# Patient Record
Sex: Female | Born: 1975 | Race: White | Hispanic: No | Marital: Married | Smoking: Never smoker
Health system: Southern US, Community
[De-identification: ages and names within clinical notes are randomized; demographics above are authoritative.]

---
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
## 1997-08-26 ENCOUNTER — Inpatient Hospital Stay (HOSPITAL_COMMUNITY): Admission: AD | Admit: 1997-08-26 | Discharge: 1997-08-28 | Payer: Self-pay | Admitting: Obstetrics and Gynecology

## 1997-10-09 ENCOUNTER — Other Ambulatory Visit: Admission: RE | Admit: 1997-10-09 | Discharge: 1997-10-09 | Payer: Self-pay | Admitting: Obstetrics and Gynecology

## 1998-10-25 ENCOUNTER — Other Ambulatory Visit: Admission: RE | Admit: 1998-10-25 | Discharge: 1998-10-25 | Payer: Self-pay | Admitting: Obstetrics and Gynecology

## 1999-10-21 ENCOUNTER — Inpatient Hospital Stay (HOSPITAL_COMMUNITY): Admission: AD | Admit: 1999-10-21 | Discharge: 1999-10-21 | Payer: Self-pay | Admitting: Obstetrics and Gynecology

## 1999-10-29 ENCOUNTER — Inpatient Hospital Stay (HOSPITAL_COMMUNITY): Admission: AD | Admit: 1999-10-29 | Discharge: 1999-10-31 | Payer: Self-pay | Admitting: Obstetrics and Gynecology

## 1999-12-11 ENCOUNTER — Other Ambulatory Visit: Admission: RE | Admit: 1999-12-11 | Discharge: 1999-12-11 | Payer: Self-pay | Admitting: Obstetrics and Gynecology

## 2001-04-08 ENCOUNTER — Other Ambulatory Visit: Admission: RE | Admit: 2001-04-08 | Discharge: 2001-04-08 | Payer: Self-pay | Admitting: Obstetrics and Gynecology

## 2002-06-17 ENCOUNTER — Other Ambulatory Visit: Admission: RE | Admit: 2002-06-17 | Discharge: 2002-06-17 | Payer: Self-pay | Admitting: Obstetrics and Gynecology

## 2003-05-19 HISTORY — PX: OTHER SURGICAL HISTORY: SHX169

## 2003-11-08 ENCOUNTER — Other Ambulatory Visit: Admission: RE | Admit: 2003-11-08 | Discharge: 2003-11-08 | Payer: Self-pay | Admitting: Obstetrics and Gynecology

## 2004-08-08 ENCOUNTER — Observation Stay (HOSPITAL_COMMUNITY): Admission: RE | Admit: 2004-08-08 | Discharge: 2004-08-09 | Payer: Self-pay | Admitting: Specialist

## 2005-04-02 ENCOUNTER — Other Ambulatory Visit: Admission: RE | Admit: 2005-04-02 | Discharge: 2005-04-02 | Payer: Self-pay | Admitting: Obstetrics and Gynecology

## 2005-08-08 ENCOUNTER — Emergency Department (HOSPITAL_COMMUNITY): Admission: EM | Admit: 2005-08-08 | Discharge: 2005-08-09 | Payer: Self-pay | Admitting: *Deleted

## 2006-03-31 IMAGING — CR DG SPINE 1V PORT
1 series · 1 of 1 positions shown · non-contrast
Comparison: none

CLINICAL DATA: HNP, left L5-S1.  Lumbar surgery.
 LUMBAR SPINE - PORTABLE 1 VIEW:
 Please note this film was submitted postoperatively for interpretation.  
 Single lateral intraoperative view of the lumbar spine demonstrates posterior metallic probe directed at the L5 vertebral body.

[view not recorded]
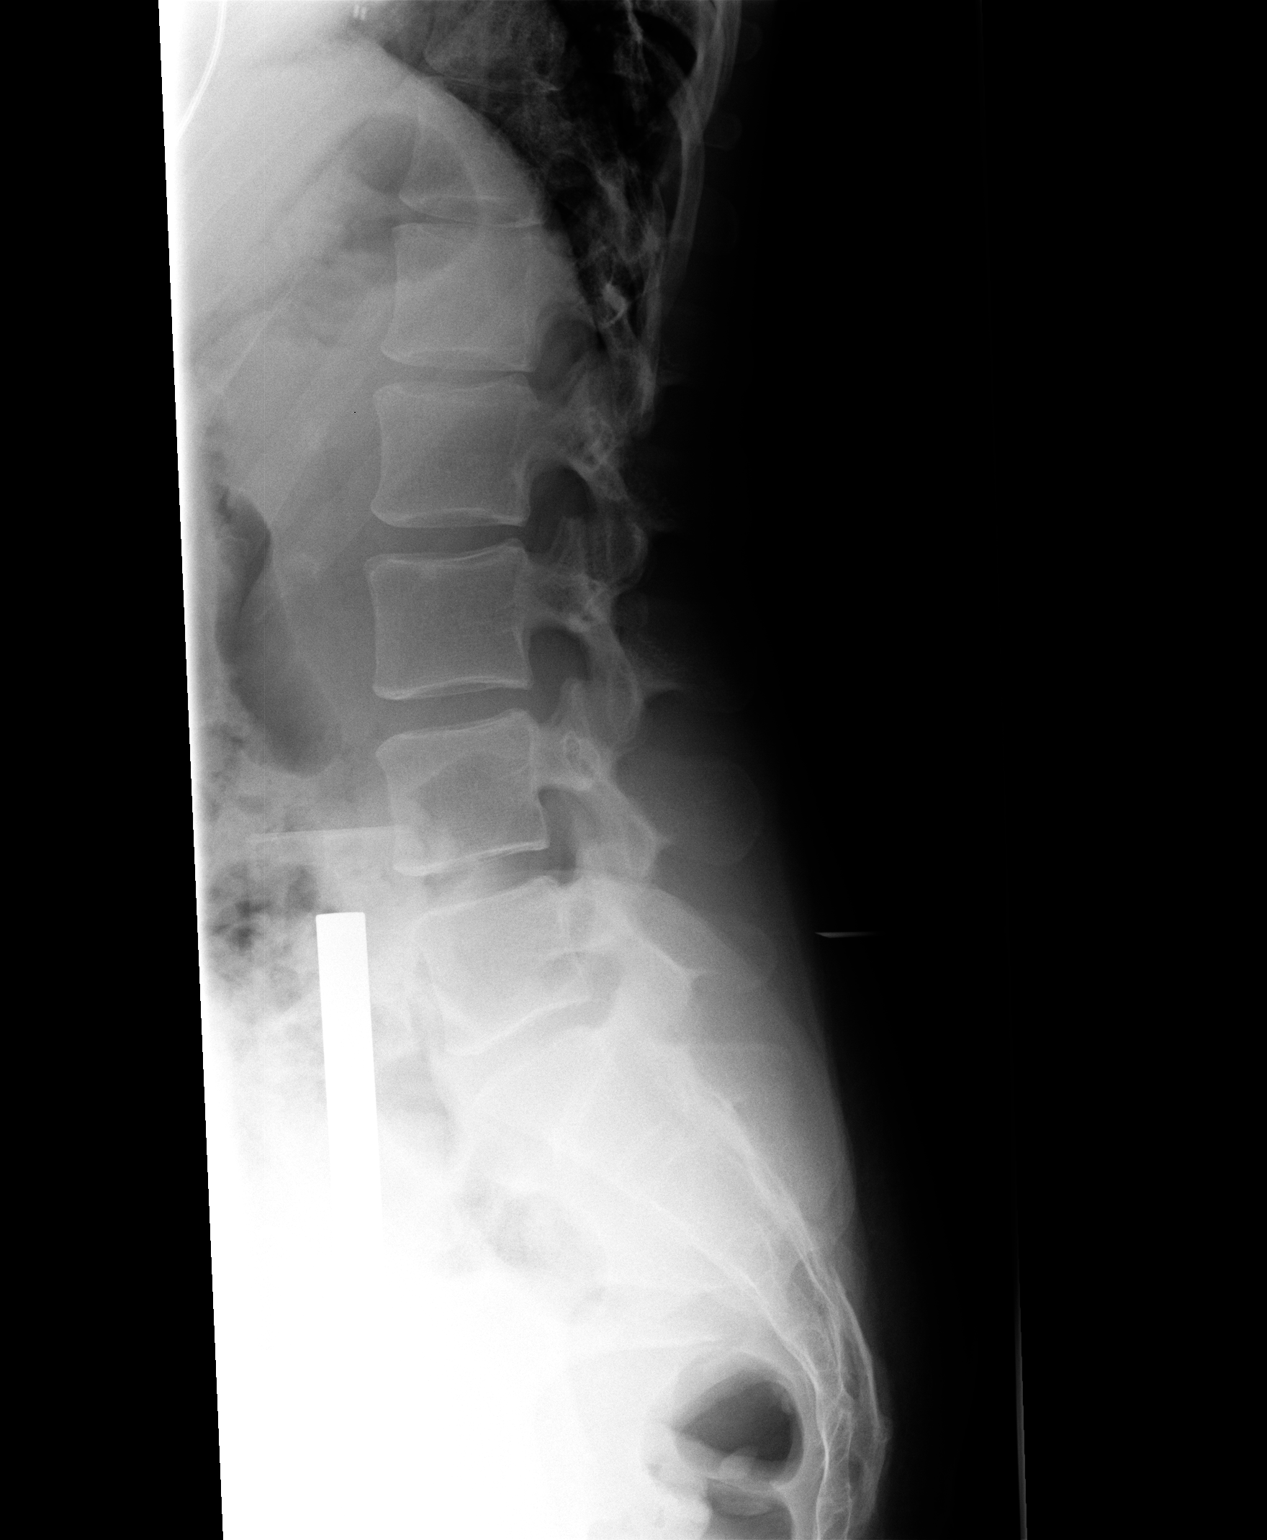

[1 of 1 positions shown; findings below may reference images not displayed]

IMPRESSION: Localizer.

## 2007-03-31 IMAGING — CR DG TIBIA/FIBULA 2V*R*
2 series · 2 of 2 positions shown · non-contrast
Comparison: none

CLINICAL DATA: Blow to the shin.  Pain.  
 RIGHT TIB/FIB ? 2 VIEW:

[view not recorded (1 of 2)]
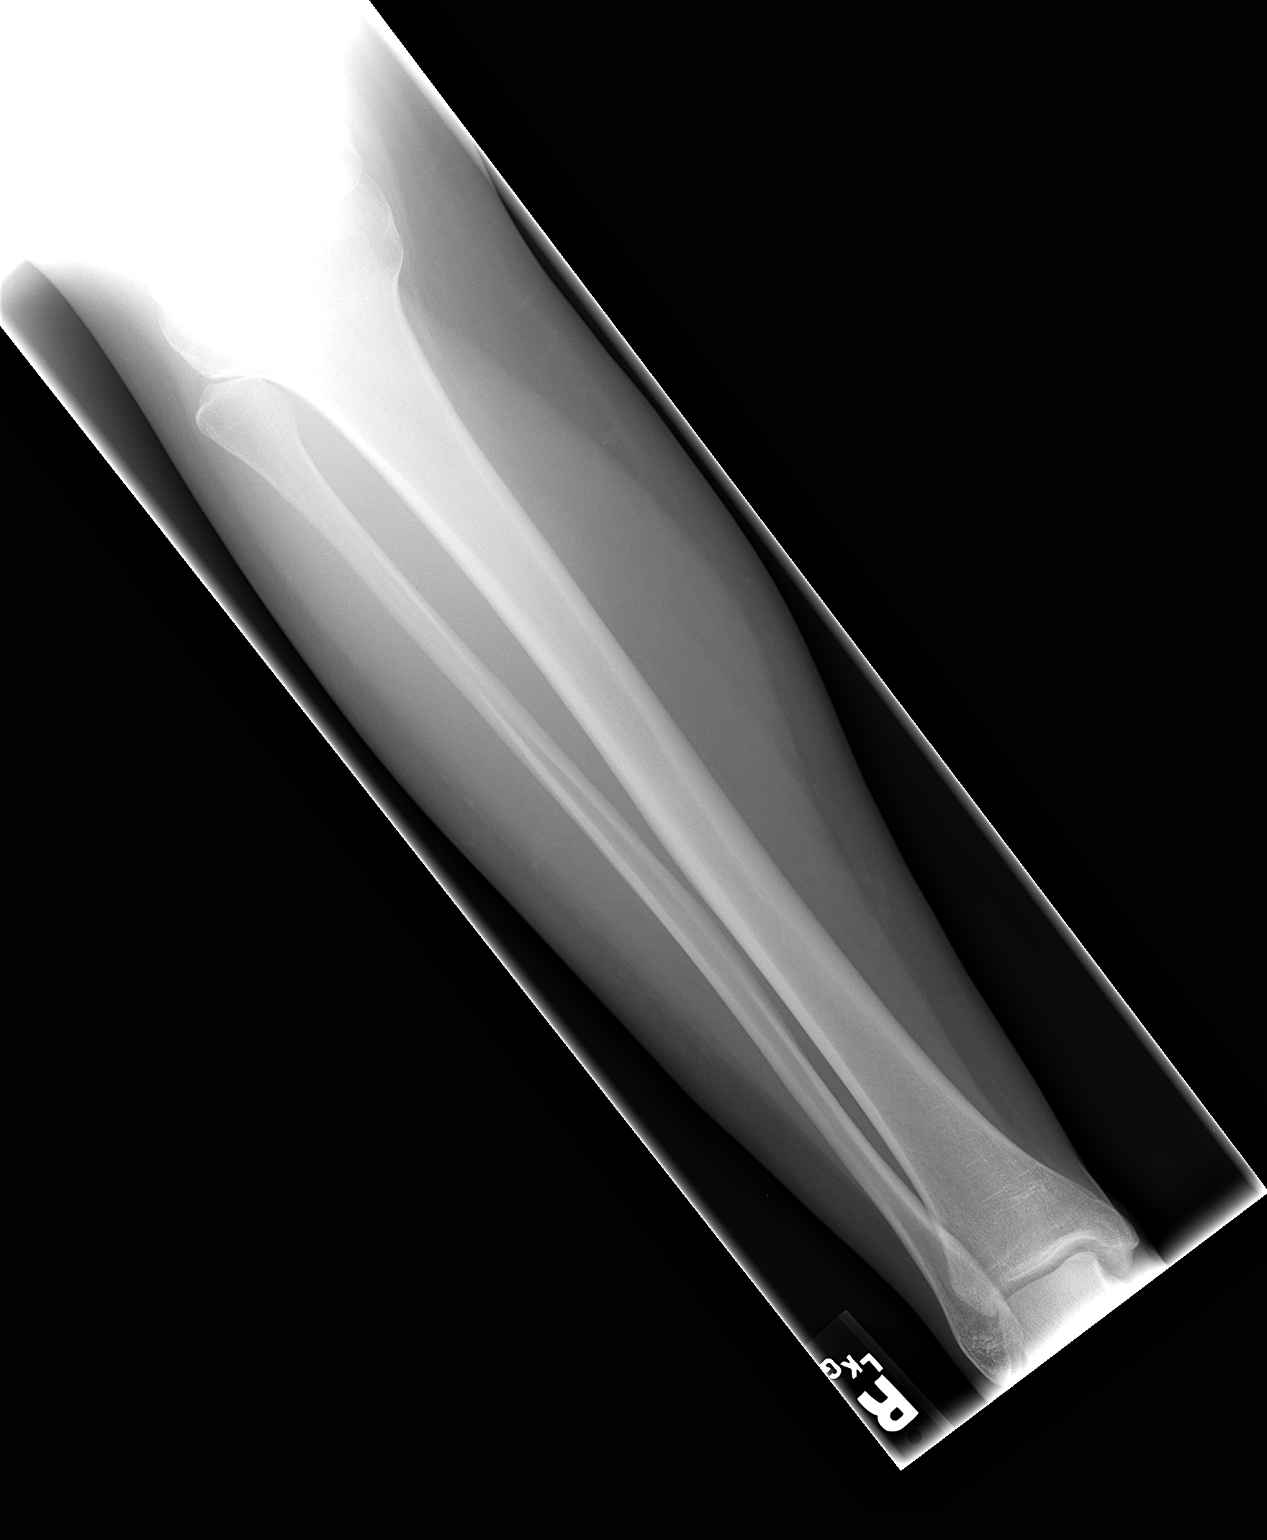

[view not recorded (2 of 2)]
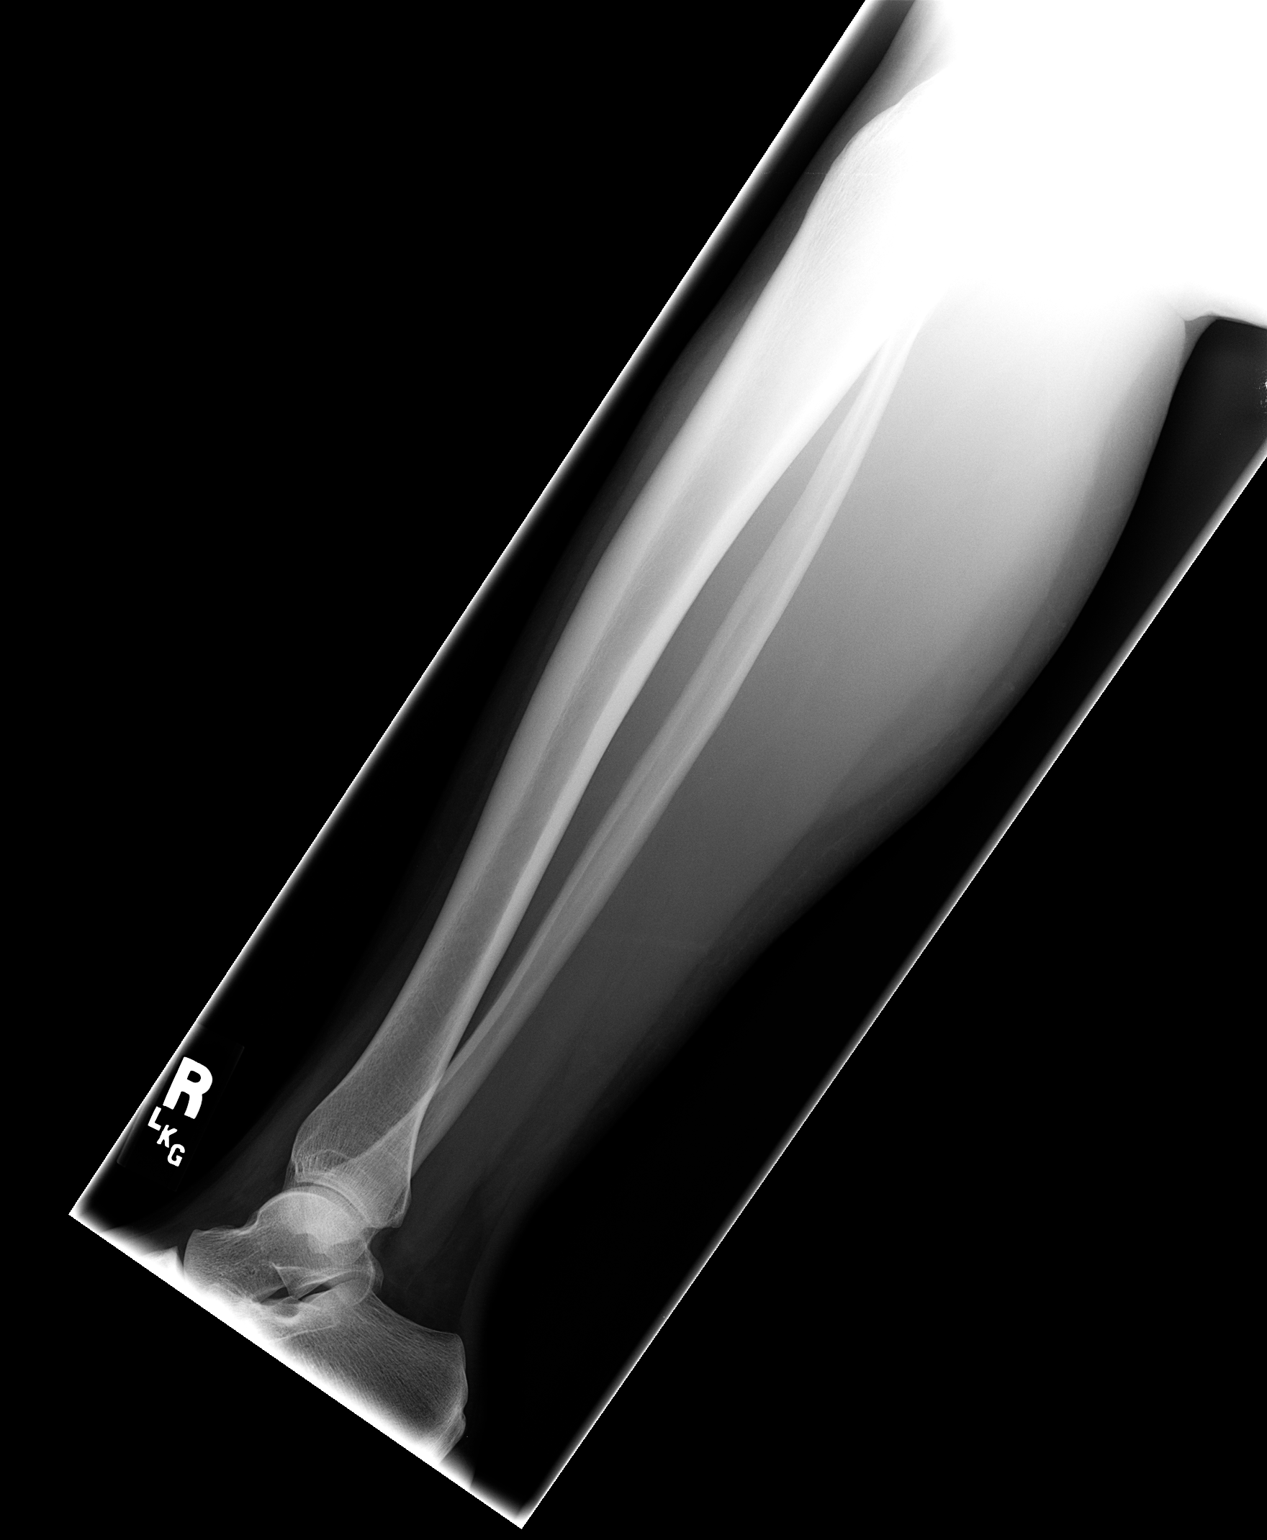

[2 of 2 positions shown; findings below may reference images not displayed]

FINDINGS: Imaged bones, joints and soft tissues appear normal.
IMPRESSION: Negative study.

## 2015-10-16 ENCOUNTER — Other Ambulatory Visit: Payer: Self-pay | Admitting: Obstetrics and Gynecology

## 2015-10-16 DIAGNOSIS — N63 Unspecified lump in unspecified breast: Secondary | ICD-10-CM

## 2015-10-19 ENCOUNTER — Other Ambulatory Visit: Payer: Self-pay

## 2015-11-30 ENCOUNTER — Ambulatory Visit
Admission: RE | Admit: 2015-11-30 | Discharge: 2015-11-30 | Disposition: A | Discharge status: Home / Self Care | Source: Ambulatory Visit | Attending: Obstetrics and Gynecology | Admitting: Obstetrics and Gynecology

## 2015-11-30 DIAGNOSIS — N63 Unspecified lump in unspecified breast: Secondary | ICD-10-CM

## 2019-07-12 ENCOUNTER — Ambulatory Visit (INDEPENDENT_AMBULATORY_CARE_PROVIDER_SITE_OTHER): Admitting: Physician Assistant

## 2019-07-12 ENCOUNTER — Encounter: Payer: Self-pay | Admitting: Physician Assistant

## 2019-07-12 ENCOUNTER — Other Ambulatory Visit: Payer: Self-pay

## 2019-07-12 VITALS — BP 122/82 | HR 94 | Temp 98.8°F | Ht 62.0 in | Wt 146.0 lb

## 2019-07-12 DIAGNOSIS — J060 Acute laryngopharyngitis: Secondary | ICD-10-CM | POA: Diagnosis not present

## 2019-07-12 MED ORDER — MONTELUKAST SODIUM 10 MG PO TABS: ORAL_TABLET | ORAL | 3 refills | Status: AC

## 2019-07-12 MED ORDER — CEFDINIR 300 MG PO CAPS: 300.0000 mg | ORAL_CAPSULE | Freq: Two times a day (BID) | ORAL | 0 refills | Status: DC

## 2019-07-12 MED ORDER — PREDNISONE 20 MG PO TABS: ORAL_TABLET | ORAL | 0 refills | Status: DC

## 2019-07-12 NOTE — Assessment & Plan Note (Signed)
rx for omnicef Use her decongestants as directed rx for prednisone rx for singulair

## 2019-07-12 NOTE — Progress Notes (Signed)
Acute Office Visit  Subjective:    Patient ID: Danielle Bautista, female    DOB: 12/15/1975, 44 y.o.   MRN: [id number]  Chief Complaint  Patient presents with  . Sinusitis    HPI Patient is in today for URI/allergies  Patient states for the past month she has had allergy symptoms with PND, drainage, runny nose and ears popping - she has tried several otc meds Now she has had a constant irritative cough that is only minimally productive Denies fever/malaise   History reviewed. No pertinent past medical history.  Past Surgical History:  Procedure Laterality Date  . MICRODISECTOMY  05/2003  . TONSILECTOMY/ADENOIDECTOMY WITH MYRINGOTOMY  1984    Family History  Problem Relation Age of Onset  . Osteoporosis Mother   . Mental illness Mother   . Hypertension Father   . Diabetes type II Father     Social History   Socioeconomic History  . Marital status: Married    Spouse name: Not on file  . Number of children: Not on file  . Years of education: Not on file  . Highest education level: Not on file  Occupational History  . Occupation: [name]  Tobacco Use  . Smoking status: Never Smoker  . Smokeless tobacco: Never Used  Substance and Sexual Activity  . Alcohol use: Not on file    Comment: Drinks approximately 1 time per week. When she drinks, the average quantity of alcohol is 3-4 drinks. She typically consumes beer, wine, and mixed drinks.   . Drug use: Never  . Sexual activity: Not on file  Other Topics Concern  . Not on file  Social History Narrative  . Not on file   Social Determinants of Health   Financial Resource Strain:   . Difficulty of Paying Living Expenses:   Food Insecurity:   . Worried About Charity fundraiser in the Last Year:   . Arboriculturist in the Last Year:   Transportation Needs:   . Film/video editor (Medical):   Marland Kitchen Lack of Transportation (Non-Medical):   Physical Activity:   . Days of Exercise per Week:   .  Minutes of Exercise per Session:   Stress:   . Feeling of Stress :   Social Connections:   . Frequency of Communication with Friends and Family:   . Frequency of Social Gatherings with Friends and Family:   . Attends Religious Services:   . Active Member of Clubs or Organizations:   . Attends Archivist Meetings:   Marland Kitchen Marital Status:   Intimate Partner Violence:   . Fear of Current or Ex-Partner:   . Emotionally Abused:   Marland Kitchen Physically Abused:   . Sexually Abused:      Current Outpatient Medications:  .  cefdinir (OMNICEF) 300 MG capsule, Take 1 capsule (300 mg total) by mouth 2 (two) times daily., Disp: 20 capsule, Rfl: 0 .  montelukast (SINGULAIR) 10 MG tablet, 1 po qhs for allergies/cough, Disp: 30 tablet, Rfl: 3 .  predniSONE (DELTASONE) 20 MG tablet, 1 po tid for 3 days 1 po bid for 3 days 1 po qd for 3 days, Disp: 18 tablet, Rfl: 0   No Known Allergies  CONSTITUTIONAL: Negative for chills, fatigue, fever, unintentional weight gain and unintentional weight loss.  E/N/T: see HPI CARDIOVASCULAR: Negative for chest pain, dizziness, palpitations and pedal edema.  RESPIRATORY:see HPI INTEGUMENTARY: Negative for rash.  NEUROLOGICAL: Negative for dizziness and headaches.  PSYCHIATRIC: Negative  for sleep disturbance and to question depression screen.  Negative for depression, negative for anhedonia.         Objective:    PHYSICAL EXAM:   VS: BP 122/82 (BP Location: Left Arm, Patient Position: Sitting)   Pulse 94   Temp 98.8 F (37.1 C) (Temporal)   Ht 5\' 2"  (1.575 m)   Wt 146 lb (66.2 kg)   SpO2 98%   BMI 26.70 kg/m   GEN: Well nourished, well developed, in no acute distress  HEENT: normal external ears and nose -- Lips, Teeth and Gums - normal  Oropharynx - mild erythema  Cardiac: RRR; no murmurs, rubs, or gallops,no edema -  Respiratory:  normal respiratory rate and pattern with no distress - normal breath sounds with no rales, rhonchi, wheezes or  rubs    Wt Readings from Last 3 Encounters:  07/12/19 146 lb (66.2 kg)    Health Maintenance Due  Topic Date Due  . HIV Screening  Never done  . PAP SMEAR-Modifier  Never done    There are no preventive care reminders to display for this patient.        Assessment & Plan:   Problem List Items Addressed This Visit      Respiratory   Acute laryngopharyngitis - Primary    rx for omnicef Use her decongestants as directed rx for prednisone rx for singulair          Meds ordered this encounter  Medications  . cefdinir (OMNICEF) 300 MG capsule    Sig: Take 1 capsule (300 mg total) by mouth 2 (two) times daily.    Dispense:  20 capsule    Refill:  0    Order Specific Question:   Supervising Provider    Answer05/27/21 Blane Ohara  . predniSONE (DELTASONE) 20 MG tablet    Sig: 1 po tid for 3 days 1 po bid for 3 days 1 po qd for 3 days    Dispense:  18 tablet    Refill:  0    Order Specific Question:   Supervising Provider    [id number] Blane Ohara  . montelukast (SINGULAIR) 10 MG tablet    Sig: 1 po qhs for allergies/cough    Dispense:  30 tablet    Refill:  3    Order Specific Question:   Supervising Provider    Answer:   [facility], [id number] Fritzi Mandes     SARA R Akilah Cureton, PA-C

## 2020-11-02 LAB — HM PAP SMEAR: HM Pap smear: NORMAL

## 2020-12-03 ENCOUNTER — Ambulatory Visit (INDEPENDENT_AMBULATORY_CARE_PROVIDER_SITE_OTHER): Admitting: Nurse Practitioner

## 2020-12-03 ENCOUNTER — Encounter: Payer: Self-pay | Admitting: Nurse Practitioner

## 2020-12-03 VITALS — BP 122/64 | HR 87 | Temp 97.3°F | Ht 62.0 in | Wt 158.0 lb

## 2020-12-03 DIAGNOSIS — J309 Allergic rhinitis, unspecified: Secondary | ICD-10-CM | POA: Diagnosis not present

## 2020-12-03 DIAGNOSIS — J018 Other acute sinusitis: Secondary | ICD-10-CM

## 2020-12-03 DIAGNOSIS — H6123 Impacted cerumen, bilateral: Secondary | ICD-10-CM | POA: Diagnosis not present

## 2020-12-03 DIAGNOSIS — H9201 Otalgia, right ear: Secondary | ICD-10-CM

## 2020-12-03 MED ORDER — AZITHROMYCIN 250 MG PO TABS: ORAL_TABLET | ORAL | 0 refills | Status: AC

## 2020-12-03 MED ORDER — FLUTICASONE PROPIONATE 50 MCG/ACT NA SUSP: 2.0000 | Freq: Every day | NASAL | 6 refills | Status: AC

## 2020-12-03 NOTE — Progress Notes (Signed)
Acute Office Visit  Subjective:    Patient ID: Danielle Bautista, female    DOB: 1975-05-08, 45 y.o.   MRN: [id number]  CC: Right ear pain  HPI: Danielle Bautista is a 45 year old Caucasian female that presents with right ear pain and sinus congestion. States onset was one week ago. Treatment has included Tylenol Sinus and Cold, Sudafed, and Ibuprofen OTC. She has also used a Netty pot. States she has a past history of chronic allergic rhinitis and ear infections.      Past Surgical History:  Procedure Laterality Date   MICRODISECTOMY  05/2003   TONSILECTOMY/ADENOIDECTOMY WITH MYRINGOTOMY  1984    Family History  Problem Relation Age of Onset   Osteoporosis Mother    Mental illness Mother    Hypertension Father    Diabetes type II Father     Social History   Socioeconomic History   Marital status: Married    Spouse name: Not on file   Number of children: Not on file   Years of education: Not on file   Highest education level: Not on file  Occupational History   Occupation: [employer]  Tobacco Use   Smoking status: Never   Smokeless tobacco: Never  Vaping Use   Vaping Use: Never used  Substance and Sexual Activity   Alcohol use: Not on file    Comment: Drinks approximately 1 time per week. When she drinks, the average quantity of alcohol is 3-4 drinks. She typically consumes beer, wine, and mixed drinks.    Drug use: Never   Sexual activity: Not on file  Other Topics Concern   Not on file  Social History Narrative   Not on file   Social Determinants of Health   Financial Resource Strain: Not on file  Food Insecurity: Not on file  Transportation Needs: Not on file  Physical Activity: Not on file  Stress: Not on file  Social Connections: Not on file  Intimate Partner Violence: Not on file    Outpatient Medications Prior to Visit  Medication Sig Dispense Refill   cefdinir (OMNICEF) 300 MG capsule Take 1 capsule (300 mg total) by mouth 2 (two) times  daily. 20 capsule 0   montelukast (SINGULAIR) 10 MG tablet 1 po qhs for allergies/cough 30 tablet 3   predniSONE (DELTASONE) 20 MG tablet 1 po tid for 3 days 1 po bid for 3 days 1 po qd for 3 days 18 tablet 0   No facility-administered medications prior to visit.    No Known Allergies  Review of Systems  Constitutional:  Negative for chills, fatigue and fever.  HENT:  Positive for congestion, ear pain (right), postnasal drip, rhinorrhea, sinus pressure, sinus pain and sore throat.   Eyes: Negative.   Respiratory:  Negative for cough and shortness of breath.   Cardiovascular:  Negative for chest pain.  Gastrointestinal:  Positive for nausea (In the a.m.). Negative for diarrhea.  Endocrine: Negative.   Genitourinary: Negative.   Allergic/Immunologic: Positive for environmental allergies.  Neurological:  Positive for headaches. Negative for dizziness.      Objective:    Physical Exam Vitals reviewed.  Constitutional:      Appearance: Normal appearance.  HENT:     Head: Normocephalic.     Right Ear: There is impacted cerumen.     Left Ear: There is impacted cerumen.     Nose: Congestion and rhinorrhea present.     Mouth/Throat:     Pharynx: Posterior oropharyngeal erythema present.  Cardiovascular:     Rate and Rhythm: Normal rate and regular rhythm.  Pulmonary:     Effort: Pulmonary effort is normal.     Breath sounds: Normal breath sounds.  Skin:    General: Skin is warm and dry.     Capillary Refill: Capillary refill takes less than 2 seconds.  Neurological:     General: No focal deficit present.     Mental Status: She is alert.    BP 122/64   Pulse 87   Temp (!) 97.3 F (36.3 C)   Ht 5\' 2"  (1.575 m)   Wt 158 lb (71.7 kg)   LMP 11/12/2020 (Approximate)   SpO2 100%   BMI 28.90 kg/m   Wt Readings from Last 3 Encounters:  07/12/19 146 lb (66.2 kg)    Health Maintenance Due  Topic Date Due   COVID-19 Vaccine (1) Never done   HIV Screening  Never done    Hepatitis C Screening  Never done   PAP SMEAR-Modifier  Never done   INFLUENZA VACCINE  Never done           Assessment & Plan:   1. Acute non-recurrent sinusitis of other sinus - fluticasone (FLONASE) 50 MCG/ACT nasal spray; Place 2 sprays into both nostrils daily.  Dispense: 16 g; Refill: 6 - azithromycin (ZITHROMAX) 250 MG tablet; Take 2 tablets on day 1, then 1 tablet daily on days 2 through 5  Dispense: 6 tablet; Refill: 0  2. Chronic allergic rhinitis - fluticasone (FLONASE) 50 MCG/ACT nasal spray; Place 2 sprays into both nostrils daily.  Dispense: 16 g; Refill: 6  3. Impacted cerumen of both ears - Ear wax removal -Debrox as needed for ear wax  4. Otalgia of right ear - Ear wax removal   -continue Ibuprofen as needed for ear pain   Take Z-pack as directed Debrox over-the-counter ear wax drops for softening future ear wax Use Flonase nasal spray daily Follow-up as needed   Follow-up: PRN, if symptoms fail to improve or worsen  An After Visit Summary was printed and given to the patient.  07/14/19, NP [facility] [phone]

## 2020-12-03 NOTE — Patient Instructions (Addendum)
Take Z-pack as directed Debrox over-the-counter ear wax drops for softening future ear wax Use Flonase nasal spray daily Follow-up as needed   Sinusitis, Adult Sinusitis is soreness and swelling (inflammation) of your sinuses. Sinuses are hollow spaces in the bones around your face. They are located: Around your eyes. In the middle of your forehead. Behind your nose. In your cheekbones. Your sinuses and nasal passages are lined with a fluid called mucus. Mucus drains out of your sinuses. Swelling can trap mucus in your sinuses. This lets germs (bacteria, virus, or fungus) grow, which leads to infection. Most of the time, this condition is caused by a virus. What are the causes? This condition is caused by: Allergies. Asthma. Germs. Things that block your nose or sinuses. Growths in the nose (nasal polyps). Chemicals or irritants in the air. Fungus (rare). What increases the risk? You are more likely to develop this condition if: You have a weak body defense system (immune system). You do a lot of swimming or diving. You use nasal sprays too much. You smoke. What are the signs or symptoms? The main symptoms of this condition are pain and a feeling of pressure around the sinuses. Other symptoms include: Stuffy nose (congestion). Runny nose (drainage). Swelling and warmth in the sinuses. Headache. Toothache. A cough that may get worse at night. Mucus that collects in the throat or the back of the nose (postnasal drip). Being unable to smell and taste. Being very tired (fatigue). A fever. Sore throat. Bad breath. How is this diagnosed? This condition is diagnosed based on: Your symptoms. Your medical history. A physical exam. Tests to find out if your condition is short-term (acute) or long-term (chronic). Your doctor may: Check your nose for growths (polyps). Check your sinuses using a tool that has a light (endoscope). Check for allergies or germs. Do imaging tests,  such as an MRI or CT scan. How is this treated? Treatment for this condition depends on the cause and whether it is short-term or long-term. If caused by a virus, your symptoms should go away on their own within 10 days. You may be given medicines to relieve symptoms. They include: Medicines that shrink swollen tissue in the nose. Medicines that treat allergies (antihistamines). A spray that treats swelling of the nostrils.  Rinses that help get rid of thick mucus in your nose (nasal saline washes). If caused by bacteria, your doctor may wait to see if you will get better without treatment. You may be given antibiotic medicine if you have: A very bad infection. A weak body defense system. If caused by growths in the nose, you may need to have surgery. Follow these instructions at home: Medicines Take, use, or apply over-the-counter and prescription medicines only as told by your doctor. These may include nasal sprays. If you were prescribed an antibiotic medicine, take it as told by your doctor. Do not stop taking the antibiotic even if you start to feel better. Hydrate and humidify  Drink enough water to keep your pee (urine) pale yellow. Use a cool mist humidifier to keep the humidity level in your home above 50%. Breathe in steam for 10-15 minutes, 3-4 times a day, or as told by your doctor. You can do this in the bathroom while a hot shower is running. Try not to spend time in cool or dry air. Rest Rest as much as you can. Sleep with your head raised (elevated). Make sure you get enough sleep each night. General instructions  Put  a warm, moist washcloth on your face 3-4 times a day, or as often as told by your doctor. This will help with discomfort. Wash your hands often with soap and water. If there is no soap and water, use hand sanitizer. Do not smoke. Avoid being around people who are smoking (secondhand smoke). Keep all follow-up visits as told by your doctor. This is  important. Contact a doctor if: You have a fever. Your symptoms get worse. Your symptoms do not get better within 10 days. Get help right away if: You have a very bad headache. You cannot stop throwing up (vomiting). You have very bad pain or swelling around your face or eyes. You have trouble seeing. You feel confused. Your neck is stiff. You have trouble breathing. Summary Sinusitis is swelling of your sinuses. Sinuses are hollow spaces in the bones around your face. This condition is caused by tissues in your nose that become inflamed or swollen. This traps germs. These can lead to infection. If you were prescribed an antibiotic medicine, take it as told by your doctor. Do not stop taking it even if you start to feel better. Keep all follow-up visits as told by your doctor. This is important. This information is not intended to replace advice given to you by your health care provider. Make sure you discuss any questions you have with your health care provider. Document Revised: 07/06/2017 Document Reviewed: 07/06/2017 Elsevier Patient Education  2022 [employer] Earache, Adult An earache, or ear pain, can be caused by many things, including: An infection. Ear wax buildup. Ear pressure. Something in the ear that should not be there (foreign body). A sore throat. Tooth problems. Jaw problems. Treatment of the earache will depend on the cause. If the cause is not clear or cannot be determined, you may need to watch your symptoms until your earache goes away or until a cause is found. Follow these instructions at home: Medicines Take or apply over-the-counter and prescription medicines only as told by your health care provider. If you were prescribed an antibiotic medicine, use it as told by your health care provider. Do not stop using the antibiotic even if you start to feel better. Do not put anything in your ear other than medicine that is prescribed by your health care  provider. Managing pain If directed, apply heat to the affected area as often as told by your health care provider. Use the heat source that your health care provider recommends, such as a moist heat pack or a heating pad. Place a towel between your skin and the heat source. Leave the heat on for 20-30 minutes. Remove the heat if your skin turns bright red. This is especially important if you are unable to feel pain, heat, or cold. You may have a greater risk of getting burned. If directed, put ice on the affected area as often as told by your health care provider. To do this:   Put ice in a plastic bag. Place a towel between your skin and the bag. Leave the ice on for 20 minutes, 2-3 times a day. General instructions Pay attention to any changes in your symptoms. Try resting in an upright position instead of lying down. This may help to reduce pressure in your ear and relieve pain. Chew gum if it helps to relieve your ear pain. Treat any allergies as told by your health care provider. Drink enough fluid to keep your urine pale yellow. It is up to you to get  the results of any tests that were done. Ask your health care provider, or the department that is doing the tests, when your results will be ready. Keep all follow-up visits as told by your health care provider. This is important. Contact a health care provider if: Your pain does not improve within 2 days. Your earache gets worse. You have new symptoms. You have a fever. Get help right away if you: Have a severe headache. Have a stiff neck. Have trouble swallowing. Have redness or swelling behind your ear. Have fluid or blood coming from your ear. Have hearing loss. Feel dizzy. Summary An earache, or ear pain, can be caused by many things. Treatment of the earache will depend on the cause. Follow recommendations from your health care provider to treat your ear pain. If the cause is not clear or cannot be determined, you may  need to watch your symptoms until your earache goes away or until a cause is found. Keep all follow-up visits as told by your health care provider. This is important. This information is not intended to replace advice given to you by your health care provider. Make sure you discuss any questions you have with your health care provider. Document Revised: 09/11/2018 Document Reviewed: 09/11/2018 Elsevier Patient Education  2022 [employer]. Earwax Buildup, Adult The ears produce a substance called earwax that helps keep bacteria out of the ear and protects the skin in the ear canal. Occasionally, earwax can build up in the ear and cause discomfort or hearing loss. What are the causes? This condition is caused by a buildup of earwax. Ear canals are self-cleaning. Ear wax is made in the outer part of the ear canal and generally falls out in small amounts over time. When the self-cleaning mechanism is not working, earwax builds up and can cause decreased hearing and discomfort. Attempting to clean ears with cotton swabs can push the earwax deep into the ear canal and cause decreased hearing and pain. What increases the risk? This condition is more likely to develop in people who: Clean their ears often with cotton swabs. Pick at their ears. Use earplugs or in-ear headphones often, or wear hearing aids. The following factors may also make you more likely to develop this condition: Being female. Being of older age. Naturally producing more earwax. Having narrow ear canals. Having earwax that is overly thick or sticky. Having excess hair in the ear canal. Having eczema. Being dehydrated. What are the signs or symptoms? Symptoms of this condition include: Reduced or muffled hearing. A feeling of fullness in the ear or feeling that the ear is plugged. Fluid coming from the ear. Ear pain or an itchy ear. Ringing in the ear. Coughing. Balance problems. An obvious piece of earwax that can be seen  inside the ear canal. How is this diagnosed? This condition may be diagnosed based on: Your symptoms. Your medical history. An ear exam. During the exam, your health care provider will look into your ear with an instrument called an otoscope. You may have tests, including a hearing test. How is this treated? This condition may be treated by: Using ear drops to soften the earwax. Having the earwax removed by a health care provider. The health care provider may: Flush the ear with water. Use an instrument that has a loop on the end (curette). Use a suction device. Having surgery to remove the wax buildup. This may be done in severe cases. Follow these instructions at home:  Take over-the-counter and  prescription medicines only as told by your health care provider. Do not put any objects, including cotton swabs, into your ear. You can clean the opening of your ear canal with a washcloth or facial tissue. Follow instructions from your health care provider about cleaning your ears. Do not overclean your ears. Drink enough fluid to keep your urine pale yellow. This will help to thin the earwax. Keep all follow-up visits as told. If earwax builds up in your ears often or if you use hearing aids, consider seeing your health care provider for routine, preventive ear cleanings. Ask your health care provider how often you should schedule your cleanings. If you have hearing aids, clean them according to instructions from the manufacturer and your health care provider. Contact a health care provider if: You have ear pain. You develop a fever. You have pus or other fluid coming from your ear. You have hearing loss. You have ringing in your ears that does not go away. You feel like the room is spinning (vertigo). Your symptoms do not improve with treatment. Get help right away if: You have bleeding from the affected ear. You have severe ear pain. Summary Earwax can build up in the ear and cause  discomfort or hearing loss. The most common symptoms of this condition include reduced or muffled hearing, a feeling of fullness in the ear, or feeling that the ear is plugged. This condition may be diagnosed based on your symptoms, your medical history, and an ear exam. This condition may be treated by using ear drops to soften the earwax or by having the earwax removed by a health care provider. Do not put any objects, including cotton swabs, into your ear. You can clean the opening of your ear canal with a washcloth or facial tissue. This information is not intended to replace advice given to you by your health care provider. Make sure you discuss any questions you have with your health care provider. Document Revised: 05/24/2019 Document Reviewed: 05/24/2019 Elsevier Patient Education  2022 [employer].

## 2022-01-22 ENCOUNTER — Ambulatory Visit (INDEPENDENT_AMBULATORY_CARE_PROVIDER_SITE_OTHER): Admitting: Physician Assistant

## 2022-01-22 ENCOUNTER — Encounter: Payer: Self-pay | Admitting: Physician Assistant

## 2022-01-22 VITALS — BP 116/80 | HR 95 | Temp 97.4°F | Ht 63.0 in | Wt 151.8 lb

## 2022-01-22 DIAGNOSIS — J069 Acute upper respiratory infection, unspecified: Secondary | ICD-10-CM | POA: Diagnosis not present

## 2022-01-22 DIAGNOSIS — H6123 Impacted cerumen, bilateral: Secondary | ICD-10-CM | POA: Diagnosis not present

## 2022-01-22 LAB — POC COVID19 BINAXNOW: SARS Coronavirus 2 Ag: NEGATIVE

## 2022-01-22 MED ORDER — AZITHROMYCIN 250 MG PO TABS: ORAL_TABLET | ORAL | 0 refills | Status: AC

## 2022-01-22 NOTE — Progress Notes (Signed)
Acute Office Visit  Subjective:    Patient ID: Danielle Bautista, female    DOB: May 07, 1975, 46 y.o.   MRN: [id number]  Chief Complaint  Patient presents with   Cough   Ear Fullness    HPI: Patient is in today for complaints of cough, congestion and ear fullness for the past week - has been taking otc meds but still with productive cough Ears stopped up as well  History reviewed. No pertinent past medical history.  Past Surgical History:  Procedure Laterality Date   MICRODISECTOMY  05/2003   TONSILECTOMY/ADENOIDECTOMY WITH MYRINGOTOMY  1984    Family History  Problem Relation Age of Onset   Osteoporosis Mother    Mental illness Mother    Hypertension Father    Diabetes type II Father     Social History   Socioeconomic History   Marital status: Married    Spouse name: Not on file   Number of children: Not on file   Years of education: Not on file   Highest education level: Not on file  Occupational History   Occupation: Danielle Bautista  Tobacco Use   Smoking status: Never   Smokeless tobacco: Never  Vaping Use   Vaping Use: Never used  Substance and Sexual Activity   Alcohol use: Not on file    Comment: Drinks approximately 1 time per week. When she drinks, the average quantity of alcohol is 3-4 drinks. She typically consumes beer, wine, and mixed drinks.    Drug use: Never   Sexual activity: Not on file  Other Topics Concern   Not on file  Social History Narrative   Not on file   Social Determinants of Health   Financial Resource Strain: Not on file  Food Insecurity: Not on file  Transportation Needs: Not on file  Physical Activity: Not on file  Stress: Not on file  Social Connections: Not on file  Intimate Partner Violence: Not on file    Outpatient Medications Prior to Visit  Medication Sig Dispense Refill   fluticasone (FLONASE) 50 MCG/ACT nasal spray Place 2 sprays into both nostrils daily. 16 g 6   montelukast (SINGULAIR) 10 MG tablet  1 po qhs for allergies/cough 30 tablet 3   No facility-administered medications prior to visit.    No Known Allergies  Review of Systems CONSTITUTIONAL: Negative for chills, fatigue, fever,  E/N/T: see HPI CARDIOVASCULAR: Negative for chest pain, dizziness,  RESPIRATORY: see HPI GASTROINTESTINAL: Negative for abdominal pain, acid reflux symptoms, constipation, diarrhea, nausea and vomiting.          Objective:    PHYSICAL EXAM:   VS: BP 116/80 (BP Location: Left Arm, Patient Position: Sitting, Cuff Size: Normal)   Pulse 95   Temp (!) 97.4 F (36.3 C) (Temporal)   Ht _0  (1.6 m)   Wt 151 lb 12.8 oz (68.9 kg)   SpO2 100%   BMI 26.89 kg/m   GEN: Well nourished, well developed, in no acute distress  HEENT: normal external ears and nose - both canals obscured by cerumen- irrigation done - hearing grossly normal - Lips, Teeth and Gums - normal  Oropharynx - normal mucosa, palate, and posterior pharynx Cardiac: RRR; no murmurs,  Respiratory:  scattered rhonchi noted Skin: warm and dry, no rash   Office Visit on 01/22/2022  Component Date Value Ref Range Status   SARS Coronavirus 2 Ag 01/22/2022 Negative  Negative Final    Health Maintenance Due  Topic Date Due  HIV Screening  Never done   Hepatitis C Screening  Never done   COLONOSCOPY (Pts 45-67yr Insurance coverage will need to be confirmed)  Never done    There are no preventive care reminders to display for this patient.   No results found for: "TSH" No results found for: "WBC", "HGB", "HCT", "MCV", "PLT" No results found for: "NA", "K", "CHLORIDE", "CO2", "GLUCOSE", "BUN", "CREATININE", "BILITOT", "ALKPHOS", "AST", "ALT", "PROT", "ALBUMIN", "CALCIUM", "ANIONGAP", "EGFR", "GFR" No results found for: "CHOL" No results found for: "HDL" No results found for: "LDLCALC" No results found for: "TRIG" No results found for: "CHOLHDL" No results found for: "HGBA1C"     Assessment & Plan:   Problem List Items  Addressed This Visit       Respiratory   Acute upper respiratory infection - Primary   Relevant Medications   azithromycin (ZITHROMAX) 250 MG tablet Otc decongestants     Nervous and Auditory   Impacted cerumen of both ears Irrigation improved - recommend use Murine ear wax removal   Meds ordered this encounter  Medications   azithromycin (ZITHROMAX) 250 MG tablet    Sig: Take 2 tablets on day 1, then 1 tablet daily on days 2 through 5    Dispense:  6 tablet    Refill:  0    Order Specific Question:   Supervising Provider    Answer:Danielle Bautista[[id number]   Orders Placed This Encounter  Procedures   POC COVID-19 BinaxNow     Follow-up: Return if symptoms worsen or fail to improve.  An After Visit Summary was printed and given to the patient.  [facility] ([phone]

## 2022-09-03 ENCOUNTER — Encounter: Payer: Self-pay | Admitting: Physician Assistant

## 2022-09-03 ENCOUNTER — Ambulatory Visit (INDEPENDENT_AMBULATORY_CARE_PROVIDER_SITE_OTHER): Admitting: Physician Assistant

## 2022-09-03 VITALS — BP 112/80 | HR 87 | Temp 97.7°F | Ht 63.0 in | Wt 145.0 lb

## 2022-09-03 DIAGNOSIS — R3129 Other microscopic hematuria: Secondary | ICD-10-CM

## 2022-09-03 DIAGNOSIS — R1084 Generalized abdominal pain: Secondary | ICD-10-CM | POA: Diagnosis not present

## 2022-09-03 DIAGNOSIS — L659 Nonscarring hair loss, unspecified: Secondary | ICD-10-CM | POA: Diagnosis not present

## 2022-09-03 DIAGNOSIS — R5383 Other fatigue: Secondary | ICD-10-CM

## 2022-09-03 DIAGNOSIS — R1032 Left lower quadrant pain: Secondary | ICD-10-CM | POA: Insufficient documentation

## 2022-09-03 DIAGNOSIS — R634 Abnormal weight loss: Secondary | ICD-10-CM

## 2022-09-03 LAB — POCT URINALYSIS DIP (CLINITEK)
Bilirubin, UA: NEGATIVE
Glucose, UA: NEGATIVE mg/dL
Ketones, POC UA: NEGATIVE mg/dL
Leukocytes, UA: NEGATIVE
Nitrite, UA: NEGATIVE
POC PROTEIN,UA: NEGATIVE
Spec Grav, UA: 1.015 (ref 1.010–1.025)
Urobilinogen, UA: 0.2 E.U./dL
pH, UA: 7 (ref 5.0–8.0)

## 2022-09-03 NOTE — Progress Notes (Signed)
Acute Office Visit  Subjective:    Patient ID: Danielle Bautista, female    DOB: 1976-02-06, 47 y.o.   MRN: [id number]  Chief Complaint  Patient presents with   fatigue    HPI: Patient is in today for complaints of fatigue for the past several months.  She states her energy level has decreased tremendously despite getting regular sleep  She has noted also other symptoms including recurrent nausea, change in bowels, has had episode of blood in bowel movement few weeks ago.  Has had intermittent lower abdominal pain and has noted some mild weight loss without trying to lose weight. Pt does see GI and has history of diverticulitis in past --- she had colonoscopy and endoscopy done in 12/2018 She has an appt already set up with Dr Loman Chroman next month Pt has not had a chronic visit or labwork with a provider in several years   Current Outpatient Medications:    fluticasone (FLONASE) 50 MCG/ACT nasal spray, Place 2 sprays into both nostrils daily., Disp: 16 g, Rfl: 6   montelukast (SINGULAIR) 10 MG tablet, 1 po qhs for allergies/cough, Disp: 30 tablet, Rfl: 3   Multiple Vitamin (DAILY VITAMIN PO), , Disp: , Rfl:   No Known Allergies  ROS CONSTITUTIONAL:see HPI E/N/T: Negative for ear pain, nasal congestion and sore throat.  CARDIOVASCULAR: Negative for chest pain, dizziness, palpitations and pedal edema.  RESPIRATORY: Negative for recent cough and dyspnea.  GASTROINTESTINAL: see HPI MSK: Negative for arthralgias and myalgias.  INTEGUMENTARY: Negative for rash.  NEUROLOGICAL: Negative for dizziness and headaches.       Objective:    PHYSICAL EXAM:   BP 112/80 (BP Location: Left Arm, Patient Position: Sitting, Cuff Size: Normal)   Pulse 87   Temp 97.7 F (36.5 C) (Temporal)   Ht 5\' 3"  (1.6 m)   Wt 145 lb (65.8 kg)   SpO2 98%   BMI 25.69 kg/m    GEN: Well nourished, well developed, in no acute distress  Cardiac: RRR; no murmurs, rubs, or gallops,no edema -  Respiratory:   normal respiratory rate and pattern with no distress - normal breath sounds with no rales, rhonchi, wheezes or rubs GI: normal bowel sounds, no masses or tenderness MS: no deformity or atrophy  Skin: warm and dry, no rash  Psych: euthymic mood, appropriate affect and demeanor Office Visit on 09/03/2022  Component Date Value Ref Range Status   Color, UA 09/03/2022 yellow  yellow Final   Clarity, UA 09/03/2022 cloudy (A)  clear Final   Glucose, UA 09/03/2022 negative  negative mg/dL Final   Bilirubin, UA 40/98/1191 negative  negative Final   Ketones, POC UA 09/03/2022 negative  negative mg/dL Final   Spec Grav, UA 47/82/9562 1.015  1.010 - 1.025 Final   Blood, UA 09/03/2022 trace-intact (A)  negative Final   pH, UA 09/03/2022 7.0  5.0 - 8.0 Final   POC PROTEIN,UA 09/03/2022 negative  negative, trace Final   Urobilinogen, UA 09/03/2022 0.2  0.2 or 1.0 E.U./dL Final   Nitrite, UA 13/09/6576 Negative  Negative Final   Leukocytes, UA 09/03/2022 Negative  Negative Final       Assessment & Plan:    Other fatigue -     POCT URINALYSIS DIP (CLINITEK) -     CBC with Differential/Platelet -     Comprehensive metabolic panel -     TSH -     VITAMIN D 25 Hydroxy (Vit-D Deficiency, Fractures) -     Iron,  TIBC and Ferritin Panel -     B12 and Folate Panel  Other microscopic hematuria -     POCT URINALYSIS DIP (CLINITEK) - will recheck at follow up visit  Hair loss -     CBC with Differential/Platelet -     Comprehensive metabolic panel -     TSH -     VITAMIN D 25 Hydroxy (Vit-D Deficiency, Fractures) -     Iron, TIBC and Ferritin Panel -     B12 and Folate Panel  Generalized abdominal pain -     CT ABDOMEN PELVIS W CONTRAST; Future  Weight loss -     CBC with Differential/Platelet -     Comprehensive metabolic panel -     TSH -     CT ABDOMEN PELVIS W CONTRAST; Future Follow up with GI as scheduled Left lower quadrant abdominal pain -     CT ABDOMEN PELVIS W CONTRAST;  Future     Follow-up: Return in about 4 weeks (around 10/01/2022) for follow-up.  An After Visit Summary was printed and given to the patient.  Jettie Pagan [facility] [phone]

## 2022-09-04 ENCOUNTER — Other Ambulatory Visit: Payer: Self-pay | Admitting: Physician Assistant

## 2022-09-04 DIAGNOSIS — E559 Vitamin D deficiency, unspecified: Secondary | ICD-10-CM

## 2022-09-04 LAB — CBC WITH DIFFERENTIAL/PLATELET
Basophils Absolute: 0 10*3/uL (ref 0.0–0.2)
Basos: 0 %
EOS (ABSOLUTE): 0.1 10*3/uL (ref 0.0–0.4)
Eos: 1 %
Hematocrit: 43.7 % (ref 34.0–46.6)
Hemoglobin: 14.5 g/dL (ref 11.1–15.9)
Immature Grans (Abs): 0 10*3/uL (ref 0.0–0.1)
Immature Granulocytes: 0 %
Lymphocytes Absolute: 1.7 10*3/uL (ref 0.7–3.1)
Lymphs: 29 %
MCH: 28.8 pg (ref 26.6–33.0)
MCHC: 33.2 g/dL (ref 31.5–35.7)
MCV: 87 fL (ref 79–97)
Monocytes Absolute: 0.3 10*3/uL (ref 0.1–0.9)
Monocytes: 5 %
Neutrophils Absolute: 3.8 10*3/uL (ref 1.4–7.0)
Neutrophils: 65 %
Platelets: 240 10*3/uL (ref 150–450)
RBC: 5.03 x10E6/uL (ref 3.77–5.28)
RDW: 12.3 % (ref 11.7–15.4)
WBC: 5.8 10*3/uL (ref 3.4–10.8)

## 2022-09-04 LAB — B12 AND FOLATE PANEL
Folate: 9.2 ng/mL (ref >3.0)
Vitamin B-12: 384 pg/mL (ref 232–1245)

## 2022-09-04 LAB — COMPREHENSIVE METABOLIC PANEL
ALT: 21 IU/L (ref 0–32)
AST: 17 IU/L (ref 0–40)
Albumin: 5 g/dL — ABNORMAL HIGH (ref 3.9–4.9)
Alkaline Phosphatase: 60 IU/L (ref 44–121)
BUN/Creatinine Ratio: 13 (ref 9–23)
BUN: 10 mg/dL (ref 6–24)
Bilirubin Total: 0.4 mg/dL (ref 0.0–1.2)
CO2: 20 mmol/L (ref 20–29)
Calcium: 9.9 mg/dL (ref 8.7–10.2)
Chloride: 101 mmol/L (ref 96–106)
Creatinine, Ser: 0.8 mg/dL (ref 0.57–1.00)
Globulin, Total: 2.4 g/dL (ref 1.5–4.5)
Glucose: 81 mg/dL (ref 70–99)
Potassium: 4.1 mmol/L (ref 3.5–5.2)
Sodium: 138 mmol/L (ref 134–144)
Total Protein: 7.4 g/dL (ref 6.0–8.5)
eGFR: 93 mL/min/{1.73_m2} (ref >59)

## 2022-09-04 LAB — IRON,TIBC AND FERRITIN PANEL
Ferritin: 130 ng/mL (ref 15–150)
Iron Saturation: 22 % (ref 15–55)
Iron: 74 ug/dL (ref 27–159)
Total Iron Binding Capacity: 331 ug/dL (ref 250–450)
UIBC: 257 ug/dL (ref 131–425)

## 2022-09-04 LAB — TSH: TSH: 2.83 u[IU]/mL (ref 0.450–4.500)

## 2022-09-04 LAB — VITAMIN D 25 HYDROXY (VIT D DEFICIENCY, FRACTURES): Vit D, 25-Hydroxy: 29.1 ng/mL — ABNORMAL LOW (ref 30.0–100.0)

## 2022-09-04 MED ORDER — VITAMIN D (ERGOCALCIFEROL) 1.25 MG (50000 UNIT) PO CAPS: 50000.0000 [IU] | ORAL_CAPSULE | ORAL | 5 refills | Status: AC

## 2022-09-09 ENCOUNTER — Telehealth: Payer: Self-pay | Admitting: Physician Assistant

## 2022-09-09 NOTE — Telephone Encounter (Signed)
   Danielle Bautista has been scheduled for the following appointment:  WHAT: ABDOMEN/PELVIS CT WHERE: [place] [facility] DATE: 09/12/22 TIME: 9:15 AM CHECK-IN  Patient has been made aware.

## 2022-09-10 ENCOUNTER — Other Ambulatory Visit: Payer: Self-pay

## 2022-09-10 DIAGNOSIS — R634 Abnormal weight loss: Secondary | ICD-10-CM

## 2022-09-10 DIAGNOSIS — R1032 Left lower quadrant pain: Secondary | ICD-10-CM

## 2022-09-10 DIAGNOSIS — R1084 Generalized abdominal pain: Secondary | ICD-10-CM

## 2022-09-16 ENCOUNTER — Telehealth: Payer: Self-pay

## 2022-09-16 NOTE — Telephone Encounter (Signed)
Patient called inquiring about CT Abdomen/pelvis results that she had done last week. I looked in patient's chart and we still have not received these results.

## 2022-09-16 NOTE — Telephone Encounter (Signed)
Please call [facility] radiology and request for them to read

## 2022-09-17 ENCOUNTER — Other Ambulatory Visit: Payer: Self-pay | Admitting: Physician Assistant

## 2022-09-17 DIAGNOSIS — K529 Noninfective gastroenteritis and colitis, unspecified: Secondary | ICD-10-CM

## 2022-09-17 MED ORDER — CIPROFLOXACIN HCL 500 MG PO TABS: 500.0000 mg | ORAL_TABLET | Freq: Two times a day (BID) | ORAL | 0 refills | Status: DC

## 2022-09-17 NOTE — Telephone Encounter (Signed)
[facility] and asked them to please read the exam per provider and they stated that would put it STAT to be read and that we should get the result today.

## 2022-09-18 ENCOUNTER — Other Ambulatory Visit: Payer: Self-pay | Admitting: Physician Assistant

## 2022-09-18 DIAGNOSIS — K529 Noninfective gastroenteritis and colitis, unspecified: Secondary | ICD-10-CM

## 2022-09-18 MED ORDER — CIPROFLOXACIN HCL 500 MG PO TABS: 500.0000 mg | ORAL_TABLET | Freq: Two times a day (BID) | ORAL | 0 refills | Status: AC

## 2022-10-07 ENCOUNTER — Ambulatory Visit: Admitting: Physician Assistant

## 2022-10-07 ENCOUNTER — Encounter: Payer: Self-pay | Admitting: Physician Assistant

## 2022-10-07 VITALS — BP 128/78 | HR 83 | Temp 98.1°F | Ht 63.0 in | Wt 142.2 lb

## 2022-10-07 DIAGNOSIS — H6121 Impacted cerumen, right ear: Secondary | ICD-10-CM

## 2022-10-07 DIAGNOSIS — R3129 Other microscopic hematuria: Secondary | ICD-10-CM

## 2022-10-07 DIAGNOSIS — R1084 Generalized abdominal pain: Secondary | ICD-10-CM | POA: Diagnosis not present

## 2022-10-07 DIAGNOSIS — R3915 Urgency of urination: Secondary | ICD-10-CM

## 2022-10-07 LAB — POCT URINALYSIS DIP (CLINITEK)
Bilirubin, UA: NEGATIVE
Glucose, UA: NEGATIVE mg/dL
Leukocytes, UA: NEGATIVE
Nitrite, UA: NEGATIVE
Spec Grav, UA: >=1.03 — AB (ref 1.010–1.025)
Urobilinogen, UA: 0.2 E.U./dL
pH, UA: 6 (ref 5.0–8.0)

## 2022-10-07 NOTE — Progress Notes (Signed)
Subjective:  Patient ID: Danielle Bautista, female    DOB: Jan 03, 1976  Age: 47 y.o. MRN: [id number]  Chief Complaint  Patient presents with   Fatigue    HPI Pt in for follow up of fatigue - she states overall she is still having some fatigue.  Had colitis at last visit with me seen on abdominal CT - since then she has seen GI and is scheduled for colonoscopy and endoscopy next month  Pt here to recheck ua - she had blood noted at last visit.  She now states that over the past several months or longer she has noted times where she has urgency to urinate a lot and other times she feels she cannot empty her bladder Was also noted on CT she has kidney stones  Pt complains of right ear being stopped up with wax      10/07/2022    1:53 PM 09/03/2022    1:28 PM 12/03/2020    8:33 AM  Depression screen PHQ 2/9  Decreased Interest 0 0 0  Down, Depressed, Hopeless 0 0 0  PHQ - 2 Score 0 0 0  Altered sleeping 1 2   Tired, decreased energy 3 3   Change in appetite 1 3   Feeling bad or failure about yourself  0 0   Trouble concentrating 1 1   Moving slowly or fidgety/restless 1 0   Suicidal thoughts 0 0   PHQ-9 Score 7 9   Difficult doing work/chores Somewhat difficult Somewhat difficult         12/03/2020    8:33 AM 09/03/2022    1:27 PM 10/07/2022    1:53 PM  Fall Risk  Falls in the past year? 0 0 0  Was there an injury with Fall? 0 0 0  Fall Risk Category Calculator 0 0 0  Fall Risk Category (Retired) Low    (RETIRED) Patient Fall Risk Level Low fall risk    Patient at Risk for Falls Due to No Fall Risks No Fall Risks No Fall Risks  Fall risk Follow up Falls evaluation completed Falls evaluation completed Falls evaluation completed     ROS CONSTITUTIONAL: Negative for chills,  fever E/N/T: see HPI CARDIOVASCULAR: Negative for chest pain, dizziness, RESPIRATORY: Negative for recent cough and dyspnea.  GASTROINTESTINAL: see HPI GU - see HPI    Current Outpatient  Medications:    fluticasone (FLONASE) 50 MCG/ACT nasal spray, Place 2 sprays into both nostrils daily., Disp: 16 g, Rfl: 6   montelukast (SINGULAIR) 10 MG tablet, 1 po qhs for allergies/cough, Disp: 30 tablet, Rfl: 3   Multiple Vitamin (DAILY VITAMIN PO), , Disp: , Rfl:    ondansetron (ZOFRAN) 4 MG tablet, Take by mouth., Disp: , Rfl:    Vitamin D, Ergocalciferol, (DRISDOL) 1.25 MG (50000 UNIT) CAPS capsule, Take 1 capsule (50,000 Units total) by mouth every 7 (seven) days., Disp: 5 capsule, Rfl: 5  History reviewed. No pertinent past medical history. Objective:  PHYSICAL EXAM:   BP 128/78 (BP Location: Left Arm, Patient Position: Sitting, Cuff Size: Normal)   Pulse 83   Temp 98.1 F (36.7 C) (Temporal)   Ht 5\' 3"  (1.6 m)   Wt 142 lb 3.2 oz (64.5 kg)   SpO2 100%   BMI 25.19 kg/m    GEN: Well nourished, well developed, in no acute distress  HEENT: left TM and canal clear - right TM was obscured with cerumen - irrigated and cleared TM normal Oropharynx - normal  mucosa, palate, and posterior pharynx  Cardiac: RRR; no murmurs, Respiratory:  normal respiratory rate and pattern with no distress - normal breath sounds with no rales, rhonchi, wheezes or rubs GI: normal bowel sounds, no masses or tenderness  Psych: euthymic mood, appropriate affect and demeanor  Office Visit on 10/07/2022  Component Date Value Ref Range Status   Color, UA 10/07/2022 straw (A)  yellow Final   Clarity, UA 10/07/2022 clear  clear Final   Glucose, UA 10/07/2022 negative  negative mg/dL Final   Bilirubin, UA 86/57/8469 negative  negative Final   Ketones, POC UA 10/07/2022 moderate (40) (A)  negative mg/dL Final   Spec Grav, UA 62/95/2841 >=1.030 (A)  1.010 - 1.025 Final   Blood, UA 10/07/2022 small (A)  negative Final   pH, UA 10/07/2022 6.0  5.0 - 8.0 Final   POC PROTEIN,UA 10/07/2022 trace  negative, trace Final   Urobilinogen, UA 10/07/2022 0.2  0.2 or 1.0 E.U./dL Final   Nitrite, UA 32/44/0102  Negative  Negative Final   Leukocytes, UA 10/07/2022 Negative  Negative Final     Assessment & Plan:    Urgency of micturition -     POCT URINALYSIS DIP (CLINITEK) -     Ambulatory referral to Urology  Generalized abdominal pain Continue to follow up with GI as scheduled Impacted cerumen of right ear Irrigation done - resolved Other microscopic hematuria -     Ambulatory referral to Urology -     Urine Culture     Follow-up: Return in about 3 months (around 01/07/2023) for follow-up.  An After Visit Summary was printed and given to the patient.  Jettie Pagan [facility] [phone]

## 2022-10-09 LAB — URINE CULTURE

## 2023-01-08 ENCOUNTER — Ambulatory Visit: Admitting: Physician Assistant
